# Patient Record
Sex: Male | Born: 1975 | Race: Black or African American | Hispanic: No | Smoking: Never smoker
Health system: Southern US, Community
[De-identification: ages and names within clinical notes are randomized; demographics above are authoritative.]

## PROBLEM LIST (undated history)

## (undated) HISTORY — PX: SKIN GRAFT: SHX250

---

## 2019-06-28 ENCOUNTER — Other Ambulatory Visit: Payer: Self-pay

## 2019-06-28 DIAGNOSIS — Z20822 Contact with and (suspected) exposure to covid-19: Secondary | ICD-10-CM

## 2019-06-30 LAB — NOVEL CORONAVIRUS, NAA: SARS-CoV-2, NAA: NOT DETECTED

## 2019-07-17 ENCOUNTER — Other Ambulatory Visit: Payer: Self-pay

## 2019-07-17 ENCOUNTER — Emergency Department (HOSPITAL_COMMUNITY)
Admission: EM | Admit: 2019-07-17 | Discharge: 2019-07-17 | Disposition: A | Discharge status: Home / Self Care | Payer: Self-pay | Attending: Emergency Medicine | Admitting: Emergency Medicine

## 2019-07-17 DIAGNOSIS — Z202 Contact with and (suspected) exposure to infections with a predominantly sexual mode of transmission: Secondary | ICD-10-CM | POA: Insufficient documentation

## 2019-07-17 LAB — URINALYSIS, ROUTINE W REFLEX MICROSCOPIC
Bacteria, UA: NONE SEEN
Bilirubin Urine: NEGATIVE
Glucose, UA: NEGATIVE mg/dL
Ketones, ur: NEGATIVE mg/dL
Leukocytes,Ua: NEGATIVE
Nitrite: NEGATIVE
Protein, ur: NEGATIVE mg/dL
Specific Gravity, Urine: 1.02 (ref 1.005–1.030)
pH: 5 (ref 5.0–8.0)

## 2019-07-17 MED ORDER — CEFTRIAXONE SODIUM 250 MG IJ SOLR
250.0000 mg | Freq: Once | INTRAMUSCULAR | Status: AC
  Administered 2019-07-17: 250 mg via INTRAMUSCULAR
  Filled 2019-07-17: qty 250

## 2019-07-17 MED ORDER — AZITHROMYCIN 250 MG PO TABS
1000.0000 mg | ORAL_TABLET | Freq: Once | ORAL | Status: AC
  Administered 2019-07-17: 13:00:00 1000 mg via ORAL
  Filled 2019-07-17: qty 4

## 2019-07-17 MED ORDER — LIDOCAINE HCL (PF) 1 % IJ SOLN
INTRAMUSCULAR | Status: AC
  Filled 2019-07-17: qty 5

## 2019-07-17 NOTE — ED Provider Notes (Signed)
[facility] EMERGENCY DEPARTMENT Provider Note   CSN: [id number] Arrival date & time: 07/17/19  1019     History   Chief Complaint Chief Complaint  Patient presents with  . Exposure to STD    HPI Juan Miranda is a 43 y.o. male.     Juan Miranda is a 43 y.o. male who presents to the ED with concern for STD exposure.  He reports that he received a phone call from his girlfriend a few days ago stating that she was having some burning.  He is unsure if she has been tested or received results yet.  He has not had any symptoms but wants to be checked.  He denies penile discharge, dysuria or urinary frequency.  Has not noted any genital lesions.  Denies any testicular pain or swelling.  No abdominal pain.  No rectal pain or pain with defecation.  No other aggravating or alleviating factors.     No past medical history on file.  There are no active problems to display for this patient.      Home Medications    Prior to Admission medications   Not on File    Family History No family history on file.  Social History Social History   Tobacco Use  . Smoking status: Not on file  Substance Use Topics  . Alcohol use: Not on file  . Drug use: Not on file     Allergies   Patient has no known allergies.   Review of Systems Review of Systems  Constitutional: Negative for chills and fever.  Genitourinary: Negative for discharge, dysuria, frequency, genital sores, hematuria, penile pain, penile swelling and testicular pain.  Skin: Negative for color change and rash.     Physical Exam Updated Vital Signs BP (!) 146/114 (BP Location: Left Arm)   Pulse 82   Temp 97.9 F (36.6 C) (Oral)   Resp 16   SpO2 98%   Physical Exam Vitals signs and nursing note reviewed. Exam conducted with a chaperone present.  Constitutional:      General: He is not in acute distress.    Appearance: He is well-developed. He is not diaphoretic.  HENT:     Head:  Normocephalic and atraumatic.  Eyes:     General:        Right eye: No discharge.        Left eye: No discharge.  Pulmonary:     Effort: Pulmonary effort is normal. No respiratory distress.  Abdominal:     General: Abdomen is flat. Bowel sounds are normal. There is no distension.     Palpations: Abdomen is soft. There is no mass.     Tenderness: There is no abdominal tenderness. There is no guarding.  Genitourinary:    Comments: Chaperone present during genital exam. No lymphadenopathy or genital lesions noted. Penis normal, no discharge noted, swab collected. No testicular pain or swelling, no palpable masses. Neurological:     Mental Status: He is alert.     Coordination: Coordination normal.  Psychiatric:        Behavior: Behavior normal.      ED Treatments / Results  Labs (all labs ordered are listed, but only abnormal results are displayed) Labs Reviewed  URINALYSIS, ROUTINE W REFLEX MICROSCOPIC - Abnormal; Notable for the following components:      Result Value   Hgb urine dipstick SMALL (*)    All other components within normal limits  GC/CHLAMYDIA PROBE AMP ([place]) NOT  AT [facility]    EKG None  Radiology No results found.  Procedures Procedures (including critical care time)  Medications Ordered in ED Medications  cefTRIAXone (ROCEPHIN) injection 250 mg (has no administration in time range)  azithromycin (ZITHROMAX) tablet 1,000 mg (has no administration in time range)     Initial Impression / Assessment and Plan / ED Course  I have reviewed the triage vital signs and the nursing notes.  Pertinent labs & imaging results that were available during my care of the patient were reviewed by me and considered in my medical decision making (see chart for details).  Patient is afebrile without abdominal tenderness, abdominal pain or painful bowel movements to indicate prostatitis.  No tenderness to palpation of the testes or epididymis to suggest orchitis or  epididymitis.  STD cultures obtained including gonorrhea and chlamydia. Offered syphilis and HIV testing but pt declined. Patient to be discharged with instructions to follow up with PCP. Discussed importance of using protection when sexually active. Pt understands that they have GC/Chlamydia cultures pending and that they will need to inform all sexual partners if results return positive. Patient has been treated prophylactically with azithromycin and Rocephin.   Final Clinical Impressions(s) / ED Diagnoses   Final diagnoses:  STD exposure    ED Discharge Orders    None       Jacqlyn Larsen, [place] 07/17/19 [place], Ankit, MD 07/17/19 1338
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
## 2019-07-18 LAB — GC/CHLAMYDIA PROBE AMP (~~LOC~~) NOT AT ARMC
Chlamydia: NEGATIVE
Neisseria Gonorrhea: NEGATIVE

## 2020-04-20 ENCOUNTER — Emergency Department (HOSPITAL_BASED_OUTPATIENT_CLINIC_OR_DEPARTMENT_OTHER)
Admission: EM | Admit: 2020-04-20 | Discharge: 2020-04-20 | Disposition: A | Discharge status: Home / Self Care | Payer: Self-pay | Attending: Emergency Medicine | Admitting: Emergency Medicine

## 2020-04-20 ENCOUNTER — Other Ambulatory Visit: Payer: Self-pay

## 2020-04-20 ENCOUNTER — Encounter (HOSPITAL_BASED_OUTPATIENT_CLINIC_OR_DEPARTMENT_OTHER): Payer: Self-pay | Admitting: *Deleted

## 2020-04-20 ENCOUNTER — Emergency Department (HOSPITAL_BASED_OUTPATIENT_CLINIC_OR_DEPARTMENT_OTHER): Payer: Self-pay

## 2020-04-20 DIAGNOSIS — Z5321 Procedure and treatment not carried out due to patient leaving prior to being seen by health care provider: Secondary | ICD-10-CM | POA: Insufficient documentation

## 2020-04-20 DIAGNOSIS — K5792 Diverticulitis of intestine, part unspecified, without perforation or abscess without bleeding: Secondary | ICD-10-CM

## 2020-04-20 DIAGNOSIS — K5732 Diverticulitis of large intestine without perforation or abscess without bleeding: Secondary | ICD-10-CM | POA: Insufficient documentation

## 2020-04-20 DIAGNOSIS — R110 Nausea: Secondary | ICD-10-CM | POA: Insufficient documentation

## 2020-04-20 LAB — COMPREHENSIVE METABOLIC PANEL
ALT: 13 U/L (ref 0–44)
ALT: 14 U/L (ref 0–44)
AST: 17 U/L (ref 15–41)
AST: 17 U/L (ref 15–41)
Albumin: 4.2 g/dL (ref 3.5–5.0)
Albumin: 4.6 g/dL (ref 3.5–5.0)
Alkaline Phosphatase: 53 U/L (ref 38–126)
Alkaline Phosphatase: 52 U/L (ref 38–126)
Anion gap: 8 (ref 5–15)
Anion gap: 11 (ref 5–15)
BUN: 10 mg/dL (ref 6–20)
BUN: 12 mg/dL (ref 6–20)
CO2: 26 mmol/L (ref 22–32)
CO2: 26 mmol/L (ref 22–32)
Calcium: 9.4 mg/dL (ref 8.9–10.3)
Calcium: 9.7 mg/dL (ref 8.9–10.3)
Chloride: 104 mmol/L (ref 98–111)
Chloride: 103 mmol/L (ref 98–111)
Creatinine, Ser: 0.95 mg/dL (ref 0.61–1.24)
Creatinine, Ser: 0.91 mg/dL (ref 0.61–1.24)
GFR calc Af Amer: >60 mL/min (ref >60)
GFR calc Af Amer: >60 mL/min (ref >60)
GFR calc non Af Amer: >60 mL/min (ref >60)
GFR calc non Af Amer: >60 mL/min (ref >60)
Glucose, Bld: 106 mg/dL — ABNORMAL HIGH (ref 70–99)
Glucose, Bld: 99 mg/dL (ref 70–99)
Potassium: 3.9 mmol/L (ref 3.5–5.1)
Potassium: 3.7 mmol/L (ref 3.5–5.1)
Sodium: 138 mmol/L (ref 135–145)
Sodium: 140 mmol/L (ref 135–145)
Total Bilirubin: 0.8 mg/dL (ref 0.3–1.2)
Total Bilirubin: 1.4 mg/dL — ABNORMAL HIGH (ref 0.3–1.2)
Total Protein: 7.6 g/dL (ref 6.5–8.1)
Total Protein: 8.4 g/dL — ABNORMAL HIGH (ref 6.5–8.1)

## 2020-04-20 LAB — CBC
HCT: 40.7 % (ref 39.0–52.0)
Hemoglobin: 14.6 g/dL (ref 13.0–17.0)
MCH: 32.7 pg (ref 26.0–34.0)
MCHC: 35.9 g/dL (ref 30.0–36.0)
MCV: 91.3 fL (ref 80.0–100.0)
Platelets: 285 10*3/uL (ref 150–400)
RBC: 4.46 MIL/uL (ref 4.22–5.81)
RDW: 13 % (ref 11.5–15.5)
WBC: 13.3 10*3/uL — ABNORMAL HIGH (ref 4.0–10.5)
nRBC: 0 % (ref 0.0–0.2)

## 2020-04-20 LAB — CBC WITH DIFFERENTIAL/PLATELET
Abs Immature Granulocytes: 0.05 10*3/uL (ref 0.00–0.07)
Basophils Absolute: 0 10*3/uL (ref 0.0–0.1)
Basophils Relative: 0 %
Eosinophils Absolute: 0.2 10*3/uL (ref 0.0–0.5)
Eosinophils Relative: 2 %
HCT: 41.1 % (ref 39.0–52.0)
Hemoglobin: 13.8 g/dL (ref 13.0–17.0)
Immature Granulocytes: 1 %
Lymphocytes Relative: 20 %
Lymphs Abs: 2 10*3/uL (ref 0.7–4.0)
MCH: 31.7 pg (ref 26.0–34.0)
MCHC: 33.6 g/dL (ref 30.0–36.0)
MCV: 94.5 fL (ref 80.0–100.0)
Monocytes Absolute: 0.9 10*3/uL (ref 0.1–1.0)
Monocytes Relative: 9 %
Neutro Abs: 6.8 10*3/uL (ref 1.7–7.7)
Neutrophils Relative %: 68 %
Platelets: 278 10*3/uL (ref 150–400)
RBC: 4.35 MIL/uL (ref 4.22–5.81)
RDW: 12.9 % (ref 11.5–15.5)
WBC: 10 10*3/uL (ref 4.0–10.5)
nRBC: 0 % (ref 0.0–0.2)

## 2020-04-20 LAB — LIPASE, BLOOD
Lipase: 64 U/L — ABNORMAL HIGH (ref 11–51)
Lipase: 24 U/L (ref 11–51)

## 2020-04-20 MED ORDER — IOHEXOL 300 MG/ML  SOLN
100.0000 mL | Freq: Once | INTRAMUSCULAR | Status: AC | PRN
  Administered 2020-04-20: 100 mL via INTRAVENOUS

## 2020-04-20 MED ORDER — ONDANSETRON 4 MG PO TBDP
4.0000 mg | ORAL_TABLET | Freq: Three times a day (TID) | ORAL | 0 refills | Status: DC | PRN
Start: 2020-04-20 — End: 2021-07-05

## 2020-04-20 MED ORDER — ONDANSETRON HCL 4 MG/2ML IJ SOLN
4.0000 mg | Freq: Once | INTRAMUSCULAR | Status: DC
  Filled 2020-04-20: qty 2

## 2020-04-20 MED ORDER — METRONIDAZOLE 500 MG PO TABS
500.0000 mg | ORAL_TABLET | Freq: Three times a day (TID) | ORAL | 0 refills | Status: AC
Start: 2020-04-20 — End: 2020-04-27

## 2020-04-20 MED ORDER — CIPROFLOXACIN HCL 500 MG PO TABS
500.0000 mg | ORAL_TABLET | Freq: Two times a day (BID) | ORAL | 0 refills | Status: AC
Start: 2020-04-20 — End: 2020-04-27

## 2020-04-20 NOTE — ED Notes (Signed)
ED Provider at bedside. 

## 2020-04-20 NOTE — ED Triage Notes (Signed)
Pt arrived via POV with reports abdominal pain, states he was seen at [facility] this morning and dc'd dx diverticulitis. Pt reports n/v  Pt states he took pill prescribed but pain worsened.

## 2020-04-20 NOTE — ED Triage Notes (Signed)
C/o intermittent abd pain that started a couple days ago. Denies any n/v/d or fevers. Has not taken anything for pain.

## 2020-04-20 NOTE — ED Notes (Signed)
First Nurse Note: Pt to ED via EMS for abd pain. Pt diagnosed with diverticulitis yesterday. Pt is having increased pain. Bp 174/102.

## 2020-04-20 NOTE — ED Provider Notes (Signed)
Medical Decision Making: Care of patient assumed from Dr. Zettie Cooley at 0700.  Agree with history, physical exam and plan.  See their note for further details.  Briefly, The pt p/w right-sided abdominal pain.  Nausea vomiting.  Overall well-appearing.  Laboratory studies show some mild elevation in lipase, otherwise unremarkable.  The patient got a CT scan and is pending..   Current plan is as follows: CT scan reassess likely discharge home.  CT scan evaluated by radiology, there shows signs of a sending diverticulitis.  The patient will be given ciprofloxacin and Flagyl as well as Zofran recommended over-the-counter pain medications.  Patient agrees this plan is discharged home.  Strict return precautions are given.   I personally reviewed and interpreted all labs/imaging.      Sabino Donovan, MD 04/20/20 [phone]
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
## 2020-04-20 NOTE — Discharge Instructions (Signed)
You can take 600 mg of ibuprofen every 6 hours, you can take 1000 mg of Tylenol every 6 hours, you can alternate these every 3 or you can take them together.  

## 2020-04-21 ENCOUNTER — Emergency Department
Admission: EM | Admit: 2020-04-21 | Discharge: 2020-04-21 | Disposition: A | Discharge status: Home / Self Care | Payer: Self-pay | Attending: Emergency Medicine | Admitting: Emergency Medicine

## 2021-07-04 ENCOUNTER — Emergency Department (HOSPITAL_COMMUNITY): Payer: BLUE CROSS/BLUE SHIELD

## 2021-07-04 ENCOUNTER — Other Ambulatory Visit: Payer: Self-pay

## 2021-07-04 ENCOUNTER — Emergency Department (HOSPITAL_COMMUNITY)
Admission: EM | Admit: 2021-07-04 | Discharge: 2021-07-05 | Disposition: A | Discharge status: Home / Self Care | Payer: Self-pay | Attending: Emergency Medicine | Admitting: Emergency Medicine

## 2021-07-04 ENCOUNTER — Ambulatory Visit (HOSPITAL_COMMUNITY)
Admission: EM | Admit: 2021-07-04 | Discharge: 2021-07-04 | Discharge status: Against Medical Advice | Payer: BLUE CROSS/BLUE SHIELD

## 2021-07-04 ENCOUNTER — Encounter (HOSPITAL_COMMUNITY): Payer: Self-pay

## 2021-07-04 DIAGNOSIS — L02619 Cutaneous abscess of unspecified foot: Secondary | ICD-10-CM

## 2021-07-04 DIAGNOSIS — R7309 Other abnormal glucose: Secondary | ICD-10-CM | POA: Insufficient documentation

## 2021-07-04 DIAGNOSIS — L02612 Cutaneous abscess of left foot: Secondary | ICD-10-CM | POA: Insufficient documentation

## 2021-07-04 NOTE — ED Notes (Signed)
Pt called in lobby with no answer. 

## 2021-07-04 NOTE — ED Provider Notes (Signed)
Emergency Medicine Provider Triage Evaluation Note  Juan Miranda , a 45 y.o. male  was evaluated in triage.  Pt complains of left foot pain.  He has had swelling that has gradually worsened over the past few days but noticed it more so yesterday.  Reports some erythema and warmth on the top of his foot as well which he noticed today.  No injury that he is aware of.  Review of Systems  Positive: Foot swelling Negative: Fever  Physical Exam  BP 132/88 (BP Location: Right Arm)   Pulse 81   Temp 98.3 F (36.8 C) (Oral)   Resp 15   Ht 6\' 5"  (1.956 m)   Wt 127 kg   SpO2 98%   BMI 33.20 kg/m  Gen:   Awake, no distress   Resp:  Normal effort  MSK:   Moves extremities without difficulty  Other:  Edema of the left foot with erythema  Medical Decision Making  Medically screening exam initiated at 10:02 PM.  Appropriate orders placed.  Juan Miranda was informed that the remainder of the evaluation will be completed by another provider, this initial triage assessment does not replace that evaluation, and the importance of remaining in the ED until their evaluation is complete.  X-ray ordered   Buddy Duty, PA-C 07/04/21 2203    2204, MD 07/08/21 [phone]

## 2021-07-04 NOTE — ED Triage Notes (Signed)
Pt complains of left foot swelling x 2 days. Pt states that there is "pus" in it.

## 2021-07-05 LAB — CBG MONITORING, ED: Glucose-Capillary: 91 mg/dL (ref 70–99)

## 2021-07-05 MED ORDER — DOXYCYCLINE HYCLATE 100 MG PO CAPS: 100.0000 mg | ORAL_CAPSULE | Freq: Two times a day (BID) | ORAL | 0 refills | Status: AC

## 2021-07-05 MED ORDER — LIDOCAINE-EPINEPHRINE 2 %-1:100000 IJ SOLN
20.0000 mL | Freq: Once | INTRAMUSCULAR | Status: AC
  Administered 2021-07-05: 05:00:00 20 mL
  Filled 2021-07-05: qty 1

## 2021-07-05 MED ORDER — POVIDONE-IODINE 5 % EX SOLN
Freq: Once | CUTANEOUS | Status: AC
  Filled 2021-07-05: qty 88.7

## 2021-07-05 MED ORDER — DOXYCYCLINE HYCLATE 100 MG PO TABS
100.0000 mg | ORAL_TABLET | Freq: Once | ORAL | Status: AC
  Administered 2021-07-05: 05:00:00 100 mg via ORAL
  Filled 2021-07-05: qty 1

## 2021-07-05 NOTE — Discharge Instructions (Signed)
Take the antibiotics as prescribed.  Perform warm soaks twice daily.  Follow-up with the podiatrist for recheck in 2 days. Keep wound clean and dry.  Return to the ED with worsening pain, fever, vomiting, spreading redness, increased drainage or other concerns.
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
## 2022-10-13 ENCOUNTER — Other Ambulatory Visit: Payer: Self-pay | Admitting: Internal Medicine

## 2022-10-13 ENCOUNTER — Ambulatory Visit
Admission: RE | Admit: 2022-10-13 | Discharge: 2022-10-13 | Disposition: A | Discharge status: Home / Self Care | Payer: BC Managed Care – PPO | Source: Ambulatory Visit | Attending: Internal Medicine | Admitting: Internal Medicine

## 2022-10-13 DIAGNOSIS — M25562 Pain in left knee: Secondary | ICD-10-CM | POA: Diagnosis not present

## 2022-10-13 DIAGNOSIS — B079 Viral wart, unspecified: Secondary | ICD-10-CM | POA: Diagnosis not present

## 2022-10-13 DIAGNOSIS — M25561 Pain in right knee: Secondary | ICD-10-CM

## 2022-10-13 DIAGNOSIS — R030 Elevated blood-pressure reading, without diagnosis of hypertension: Secondary | ICD-10-CM | POA: Diagnosis not present

## 2022-10-13 DIAGNOSIS — Z23 Encounter for immunization: Secondary | ICD-10-CM | POA: Diagnosis not present

## 2022-10-13 DIAGNOSIS — Z113 Encounter for screening for infections with a predominantly sexual mode of transmission: Secondary | ICD-10-CM | POA: Diagnosis not present

## 2022-10-28 DIAGNOSIS — I10 Essential (primary) hypertension: Secondary | ICD-10-CM | POA: Diagnosis not present

## 2022-12-31 IMAGING — DX DG FOOT COMPLETE 3+V*L*
3 series · 3 of 3 positions shown · non-contrast
Comparison: None.

CLINICAL DATA: Foot pain/swelling

EXAM:
LEFT FOOT - COMPLETE 3+ VIEW

[foot obl]
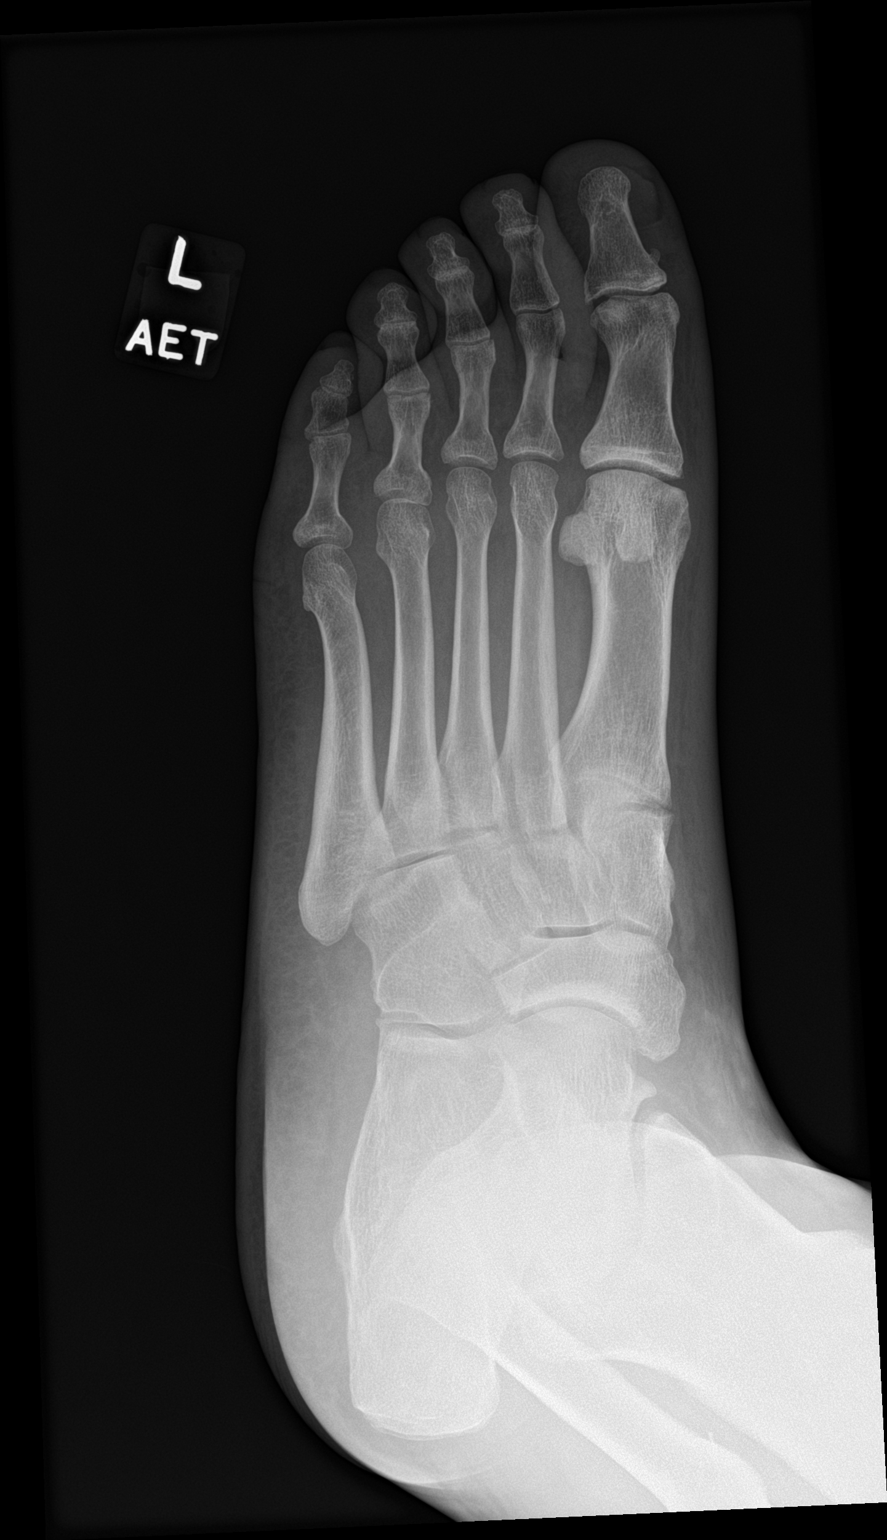

[foot lat]
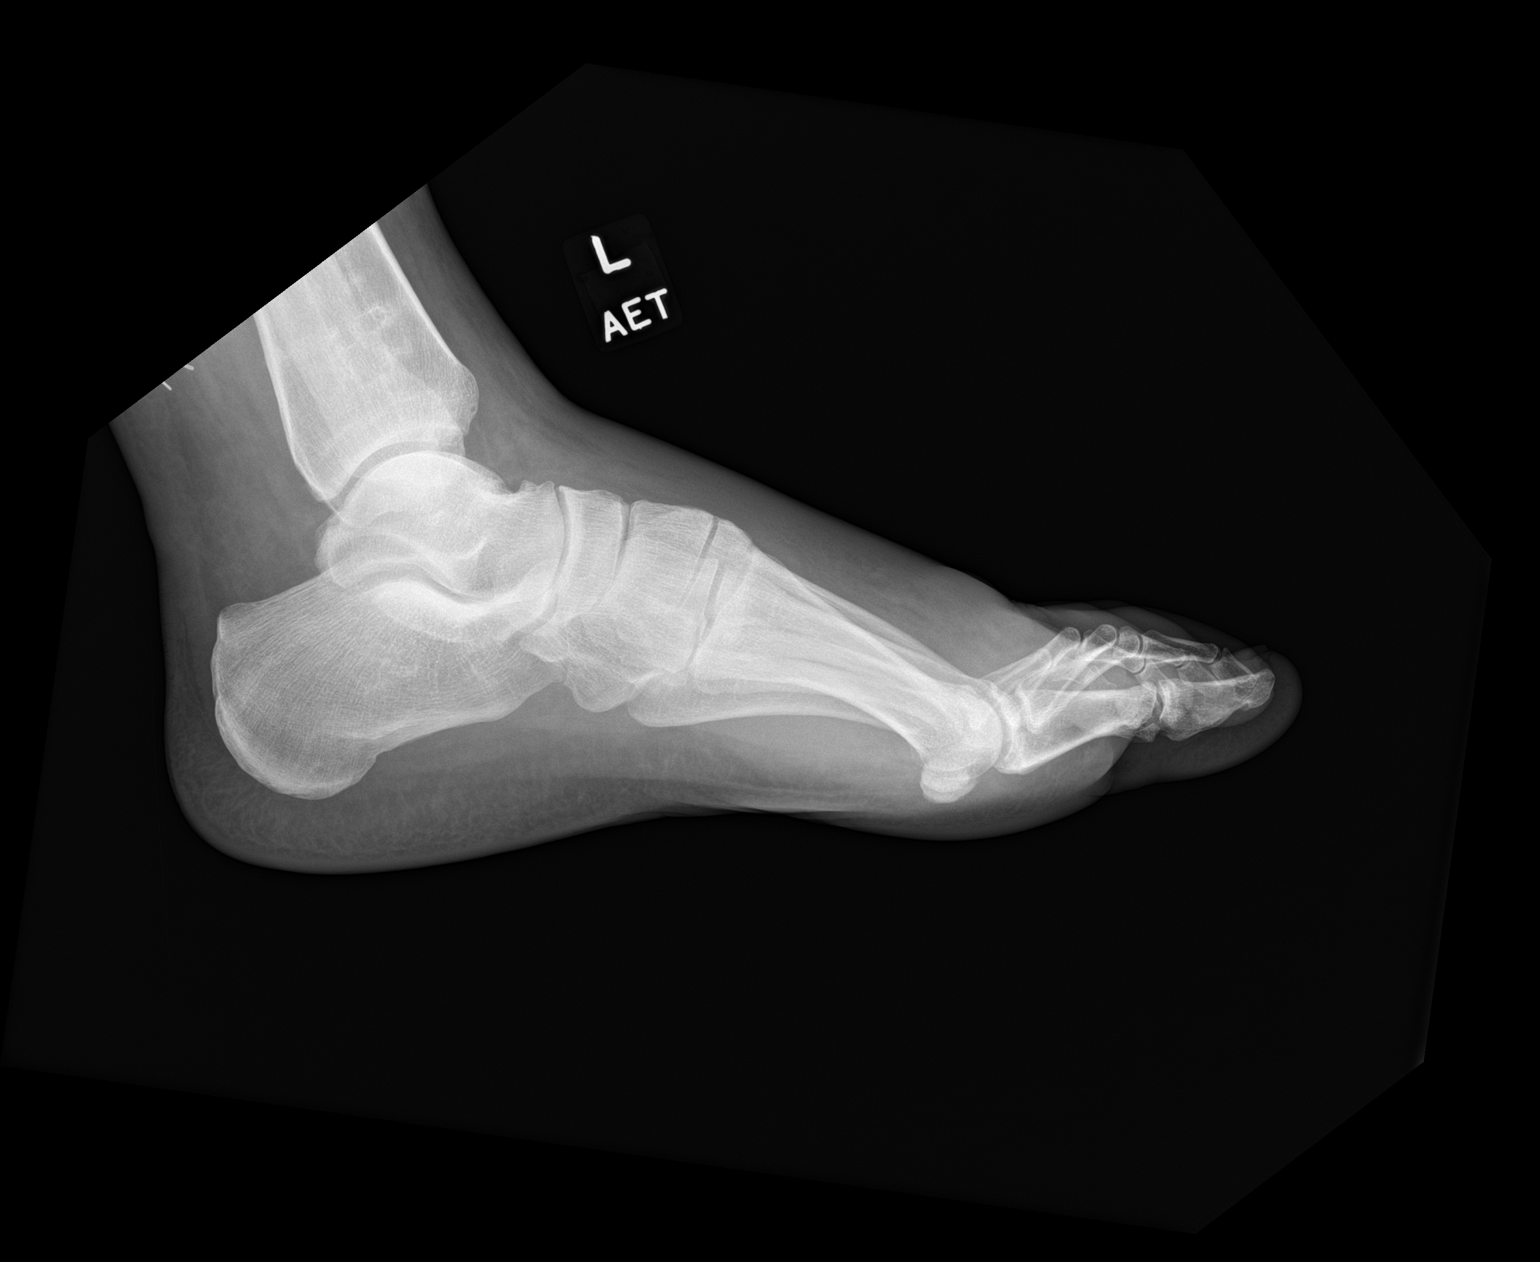

[foot ap]
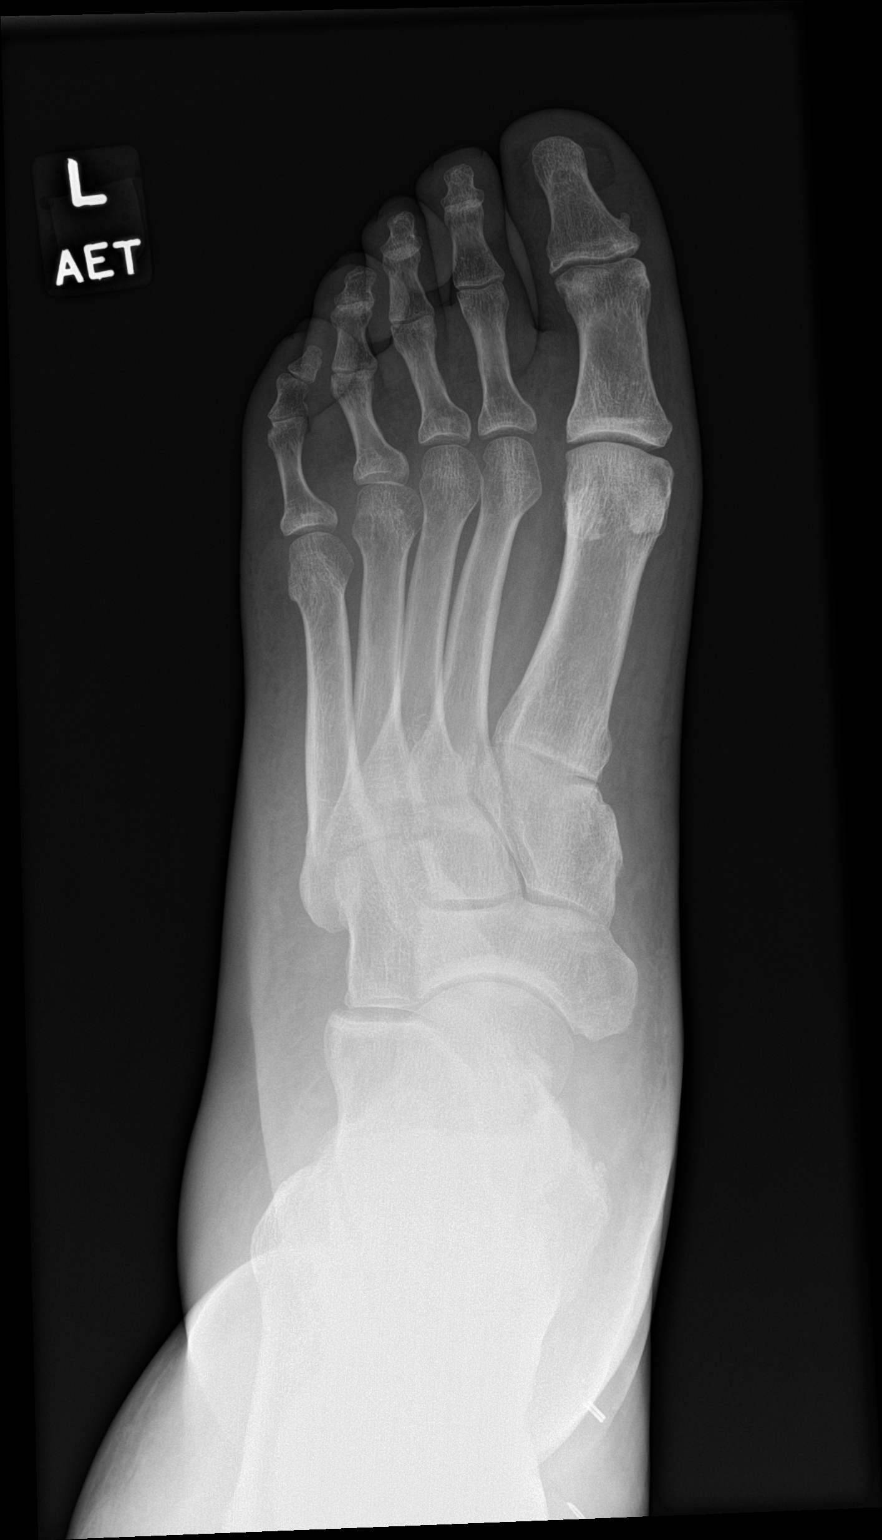

[3 of 3 positions shown; findings below may reference images not displayed]

FINDINGS: No fracture or dislocation is seen.

The joint spaces are preserved.

Mild dorsal soft tissue swelling along the forefoot.
IMPRESSION: Mild dorsal soft tissue swelling.

No fracture or dislocation is seen.

## 2023-07-06 ENCOUNTER — Other Ambulatory Visit: Payer: Self-pay | Admitting: Family Medicine

## 2023-07-06 ENCOUNTER — Ambulatory Visit (INDEPENDENT_AMBULATORY_CARE_PROVIDER_SITE_OTHER): Payer: Self-pay

## 2023-07-06 DIAGNOSIS — T1490XA Injury, unspecified, initial encounter: Secondary | ICD-10-CM

## 2023-07-06 DIAGNOSIS — M1711 Unilateral primary osteoarthritis, right knee: Secondary | ICD-10-CM

## 2023-07-06 DIAGNOSIS — M25461 Effusion, right knee: Secondary | ICD-10-CM
